# Patient Record
Sex: Male | Born: 1995 | Race: Black or African American | Hispanic: No | Marital: Single | Smoking: Never smoker
Health system: Southern US, Community
[De-identification: ages and names within clinical notes are randomized; demographics above are authoritative.]

## PROBLEM LIST (undated history)

## (undated) HISTORY — PX: ACHILLES TENDON REPAIR: SUR1153

---
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
## 2013-07-24 ENCOUNTER — Emergency Department (INDEPENDENT_AMBULATORY_CARE_PROVIDER_SITE_OTHER): Payer: Self-pay

## 2013-07-24 ENCOUNTER — Encounter: Payer: Self-pay | Admitting: Emergency Medicine

## 2013-07-24 ENCOUNTER — Emergency Department
Admission: EM | Admit: 2013-07-24 | Discharge: 2013-07-24 | Disposition: A | Discharge status: Home / Self Care | Payer: Self-pay | Source: Home / Self Care | Attending: Emergency Medicine | Admitting: Emergency Medicine

## 2013-07-24 DIAGNOSIS — S1093XA Contusion of unspecified part of neck, initial encounter: Secondary | ICD-10-CM

## 2013-07-24 DIAGNOSIS — S0093XA Contusion of unspecified part of head, initial encounter: Secondary | ICD-10-CM

## 2013-07-24 DIAGNOSIS — R6884 Jaw pain: Secondary | ICD-10-CM

## 2013-07-24 DIAGNOSIS — S0003XA Contusion of scalp, initial encounter: Secondary | ICD-10-CM

## 2013-07-24 DIAGNOSIS — H5710 Ocular pain, unspecified eye: Secondary | ICD-10-CM

## 2013-07-24 DIAGNOSIS — S0083XA Contusion of other part of head, initial encounter: Secondary | ICD-10-CM
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
## 2014-03-06 IMAGING — CR DG FINGER THUMB 2+V*L*
1 series · 1 of 1 positions shown · non-contrast
Comparison: None.

CLINICAL DATA: Injury

LEFT THUMB 2+V

[view not recorded]
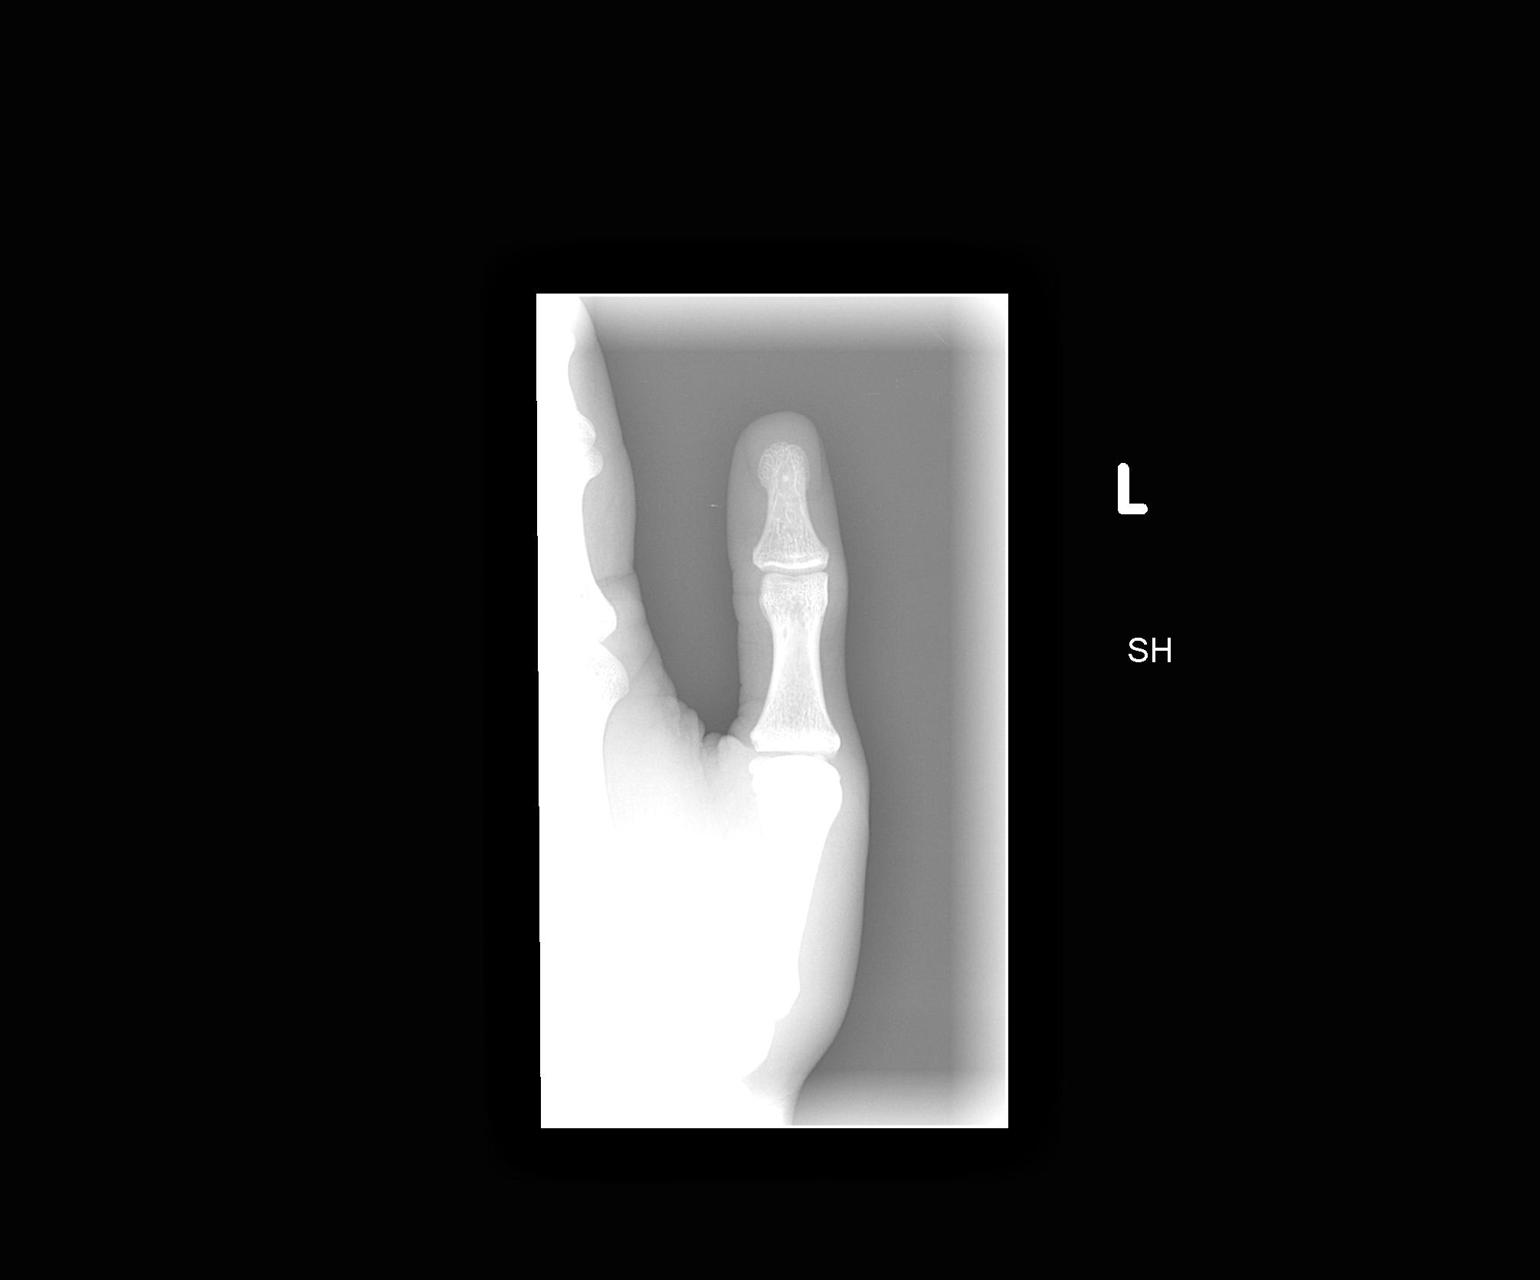

[1 of 1 positions shown; findings below may reference images not displayed]

FINDINGS: No acute fracture and no dislocation.  Unremarkable soft
tissues peri
IMPRESSION: No acute bony injury.
